# Patient Record
Sex: Male | Born: 2000 | Race: White | Hispanic: No | Marital: Single | Smoking: Never smoker
Health system: Southern US, Community
[De-identification: ages and names within clinical notes are randomized; demographics above are authoritative.]

## PROBLEM LIST (undated history)

## (undated) DIAGNOSIS — J45909 Unspecified asthma, uncomplicated: Secondary | ICD-10-CM

## (undated) HISTORY — PX: MOLE REMOVAL: SHX2046

---

## 2019-12-11 ENCOUNTER — Emergency Department: Payer: 59 | Admitting: Certified Registered"

## 2019-12-11 ENCOUNTER — Emergency Department: Payer: 59

## 2019-12-11 ENCOUNTER — Ambulatory Visit
Admission: EM | Admit: 2019-12-11 | Discharge: 2019-12-11 | Disposition: A | Discharge status: Home / Self Care | Payer: 59 | Attending: Emergency Medicine | Admitting: Emergency Medicine

## 2019-12-11 ENCOUNTER — Encounter
Admission: EM | Disposition: A | Discharge status: Home / Self Care | Payer: Self-pay | Source: Home / Self Care | Attending: Emergency Medicine

## 2019-12-11 ENCOUNTER — Other Ambulatory Visit: Payer: Self-pay

## 2019-12-11 DIAGNOSIS — K200 Eosinophilic esophagitis: Secondary | ICD-10-CM | POA: Diagnosis not present

## 2019-12-11 DIAGNOSIS — T18128A Food in esophagus causing other injury, initial encounter: Secondary | ICD-10-CM | POA: Diagnosis not present

## 2019-12-11 DIAGNOSIS — X58XXXA Exposure to other specified factors, initial encounter: Secondary | ICD-10-CM | POA: Diagnosis not present

## 2019-12-11 DIAGNOSIS — J45909 Unspecified asthma, uncomplicated: Secondary | ICD-10-CM | POA: Diagnosis not present

## 2019-12-11 DIAGNOSIS — Z79899 Other long term (current) drug therapy: Secondary | ICD-10-CM | POA: Diagnosis not present

## 2019-12-11 DIAGNOSIS — Z20822 Contact with and (suspected) exposure to covid-19: Secondary | ICD-10-CM | POA: Insufficient documentation

## 2019-12-11 HISTORY — PX: ESOPHAGOGASTRODUODENOSCOPY (EGD) WITH PROPOFOL: SHX5813

## 2019-12-11 HISTORY — DX: Unspecified asthma, uncomplicated: J45.909

## 2019-12-11 LAB — CBC WITH DIFFERENTIAL/PLATELET
Abs Immature Granulocytes: 0.03 10*3/uL (ref 0.00–0.07)
Basophils Absolute: 0 10*3/uL (ref 0.0–0.1)
Basophils Relative: 0 %
Eosinophils Absolute: 1.1 10*3/uL — ABNORMAL HIGH (ref 0.0–0.5)
Eosinophils Relative: 12 %
HCT: 40.8 % (ref 39.0–52.0)
Hemoglobin: 13.8 g/dL (ref 13.0–17.0)
Immature Granulocytes: 0 %
Lymphocytes Relative: 18 %
Lymphs Abs: 1.6 10*3/uL (ref 0.7–4.0)
MCH: 28 pg (ref 26.0–34.0)
MCHC: 33.8 g/dL (ref 30.0–36.0)
MCV: 82.9 fL (ref 80.0–100.0)
Monocytes Absolute: 0.6 10*3/uL (ref 0.1–1.0)
Monocytes Relative: 7 %
Neutro Abs: 5.6 10*3/uL (ref 1.7–7.7)
Neutrophils Relative %: 63 %
Platelets: 276 10*3/uL (ref 150–400)
RBC: 4.92 MIL/uL (ref 4.22–5.81)
RDW: 14.3 % (ref 11.5–15.5)
WBC: 9 10*3/uL (ref 4.0–10.5)
nRBC: 0 % (ref 0.0–0.2)

## 2019-12-11 LAB — BASIC METABOLIC PANEL
Anion gap: 10 (ref 5–15)
BUN: 19 mg/dL (ref 6–20)
CO2: 23 mmol/L (ref 22–32)
Calcium: 9.3 mg/dL (ref 8.9–10.3)
Chloride: 105 mmol/L (ref 98–111)
Creatinine, Ser: 0.87 mg/dL (ref 0.61–1.24)
GFR, Estimated: >60 mL/min (ref >60)
Glucose, Bld: 98 mg/dL (ref 70–99)
Potassium: 4 mmol/L (ref 3.5–5.1)
Sodium: 138 mmol/L (ref 135–145)

## 2019-12-11 LAB — RESPIRATORY PANEL BY RT PCR (FLU A&B, COVID)
Influenza A by PCR: NEGATIVE
Influenza B by PCR: NEGATIVE
SARS Coronavirus 2 by RT PCR: NEGATIVE

## 2019-12-11 SURGERY — ESOPHAGOGASTRODUODENOSCOPY (EGD) WITH PROPOFOL
Anesthesia: General

## 2019-12-11 MED ORDER — SODIUM CHLORIDE 0.9 % IV SOLN: INTRAVENOUS | Status: DC

## 2019-12-11 MED ORDER — PROPOFOL 10 MG/ML IV BOLUS
INTRAVENOUS | Status: DC | PRN
  Administered 2019-12-11: 20 mg via INTRAVENOUS
  Administered 2019-12-11: 180 mg via INTRAVENOUS

## 2019-12-11 MED ORDER — MIDAZOLAM HCL 2 MG/2ML IJ SOLN
INTRAMUSCULAR | Status: DC | PRN
  Administered 2019-12-11: 2 mg via INTRAVENOUS

## 2019-12-11 MED ORDER — LIDOCAINE HCL (PF) 2 % IJ SOLN
INTRAMUSCULAR | Status: AC
  Filled 2019-12-11: qty 5

## 2019-12-11 MED ORDER — SUCCINYLCHOLINE CHLORIDE 20 MG/ML IJ SOLN
INTRAMUSCULAR | Status: DC | PRN
  Administered 2019-12-11: 100 mg via INTRAVENOUS

## 2019-12-11 MED ORDER — OMEPRAZOLE 20 MG PO CPDR: 40.0000 mg | DELAYED_RELEASE_CAPSULE | Freq: Every day | ORAL | 2 refills | Status: AC

## 2019-12-11 MED ORDER — SODIUM CHLORIDE 0.9 % IV SOLN
INTRAVENOUS | Status: DC
  Administered 2019-12-11: 1000 mL via INTRAVENOUS

## 2019-12-11 MED ORDER — GLUCAGON HCL (RDNA) 1 MG IJ SOLR
1.0000 mg | Freq: Once | INTRAMUSCULAR | Status: AC
  Administered 2019-12-11: 1 mg via INTRAVENOUS
  Filled 2019-12-11: qty 1

## 2019-12-11 MED ORDER — MIDAZOLAM HCL 2 MG/2ML IJ SOLN
INTRAMUSCULAR | Status: AC
  Filled 2019-12-11: qty 2

## 2019-12-11 MED ORDER — PROPOFOL 10 MG/ML IV BOLUS
INTRAVENOUS | Status: AC
  Filled 2019-12-11: qty 20

## 2019-12-11 MED ORDER — ONDANSETRON HCL 4 MG/2ML IJ SOLN
INTRAMUSCULAR | Status: AC
  Filled 2019-12-11: qty 2

## 2019-12-11 MED ORDER — DEXAMETHASONE SODIUM PHOSPHATE 10 MG/ML IJ SOLN
INTRAMUSCULAR | Status: DC | PRN
  Administered 2019-12-11: 10 mg via INTRAVENOUS

## 2019-12-11 MED ORDER — ONDANSETRON HCL 4 MG/2ML IJ SOLN
INTRAMUSCULAR | Status: DC | PRN
  Administered 2019-12-11: 4 mg via INTRAVENOUS

## 2019-12-11 MED ORDER — SUCCINYLCHOLINE CHLORIDE 200 MG/10ML IV SOSY
PREFILLED_SYRINGE | INTRAVENOUS | Status: AC
  Filled 2019-12-11: qty 10

## 2019-12-11 MED ORDER — LIDOCAINE HCL (CARDIAC) PF 100 MG/5ML IV SOSY
PREFILLED_SYRINGE | INTRAVENOUS | Status: DC | PRN
  Administered 2019-12-11: 60 mg via INTRAVENOUS

## 2019-12-11 MED ORDER — DEXAMETHASONE SODIUM PHOSPHATE 10 MG/ML IJ SOLN
INTRAMUSCULAR | Status: AC
  Filled 2019-12-11: qty 1

## 2019-12-11 NOTE — ED Notes (Signed)
No relief from glucagon

## 2019-12-11 NOTE — Op Note (Signed)
[facility]enterology Patient Name: Willie Woods Procedure Date: 12/11/2019 1:15 PM MRN: [id number] Account #: [id number] Date of Birth: [birth year] Admit Type: Outpatient Age: 19 Room: [facility] ENDO ROOM 4 Gender: Male Note Status: Finalized Procedure:             Upper GI endoscopy Indications:           Foreign body in the esophagus Providers:             Wyline Mood MD, MD Medicines:             Monitored Anesthesia Care, General Anesthesia Complications:         No immediate complications. Procedure:             Pre-Anesthesia Assessment:                        - Prior to the procedure, a History and Physical was                         performed, and patient medications, allergies and                         sensitivities were reviewed. The patient's tolerance                         of previous anesthesia was reviewed.                        - The risks and benefits of the procedure and the                         sedation options and risks were discussed with the                         patient. All questions were answered and informed                         consent was obtained.                        - ASA Grade Assessment: I - A normal, healthy patient.                        After obtaining informed consent, the endoscope was                         passed under direct vision. Throughout the procedure,                         the patient's blood pressure, pulse, and oxygen                         saturations were monitored continuously. The Endoscope                         was introduced through the mouth, and advanced to the                         third part of duodenum. The upper GI endoscopy was  accomplished with ease. The patient tolerated the                         procedure well. Findings:      Food was found in the lower third of the esophagus. Removal of food was       accomplished.      The stomach was  normal.      The examined duodenum was normal.      The cardia and gastric fundus were normal on retroflexion.      Mucosal changes including longitudinal furrows, white plaques,       congestion (edema), crepe paper esophagus and white specks were found in       the entire esophagus. Esophageal findings were graded using the       Eosinophilic Esophagitis Endoscopic Reference Score (EoE-EREFS) as:       Edema Grade 1 Present (decreased clarity or absence of vascular       markings), Rings Grade 1 Mild (subtle circumferential ridges seen on       esophageal distension), Exudates Grade 1 Mild (scattered white lesions       involving less than 10 percent of the esophageal surface area), Furrows       Grade 1 Present (vertical lines with or without visible depth) and       Stricture none (no stricture found). Impression:            - Food in the lower third of the esophagus. Removal                         was successful.                        - Normal stomach.                        - Normal examined duodenum.                        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 Recommendation:        - Discharge patient to home (with escort).                        - Mechanical soft diet avoid meat particularly beef,                         chicken ,pork.                        - Continue present medications.                        - Use Prilosec (omeprazole) 40 mg PO daily for 2                         months.                        - Return to GI office who is established with  previously for follow up. Procedure Code(s):     --- Professional ---                        [phone], Esophagogastroduodenoscopy, flexible,                         transoral; with removal of foreign body(s) Diagnosis Code(s):     --- Professional ---                        V78.469G, Food in esophagus causing other injury,                          initial encounter                        K22.8, Other specified diseases of esophagus                        T18.108A, Unspecified foreign body in esophagus                         causing other injury, initial encounter CPT copyright 2019 American Medical Association. All rights reserved. The codes documented in this report are preliminary and upon coder review may  be revised to meet current compliance requirements. Wyline Mood, MD Wyline Mood MD, MD 12/11/2019 2:40:03 PM This report has been signed electronically. Number of Addenda: 0 Note Initiated On: 12/11/2019 1:15 PM Estimated Blood Loss:  Estimated blood loss: none.      [facility]

## 2019-12-11 NOTE — H&P (Signed)
Wyline Mood, MD [address], [place], [place], [address] [address], [place], [place], [address] Phone: [phone]  Fax: [phone]  Primary Care Physician:  Patient, No Pcp Per   Pre-Procedure History & Physical: HPI:  Willie Woods is a 19 y.o. male is here for an endoscopy    Past Medical History:  Diagnosis Date  . Asthma     Past Surgical History:  Procedure Laterality Date  . MOLE REMOVAL      Prior to Admission medications   Not on File    Allergies as of 12/11/2019  . (No Known Allergies)    No family history on file.  Social History   Socioeconomic History  . Marital status: Single    Spouse name: Not on file  . Number of children: Not on file  . Years of education: Not on file  . Highest education level: Not on file  Occupational History  . Not on file  Tobacco Use  . Smoking status: Never Smoker  . Smokeless tobacco: Never Used  Substance and Sexual Activity  . Alcohol use: Yes    Comment: Occassional  . Drug use: Not Currently  . Sexual activity: Not on file  Other Topics Concern  . Not on file  Social History Narrative  . Not on file   Social Determinants of Health   Financial Resource Strain:   . Difficulty of Paying Living Expenses: Not on file  Food Insecurity:   . Worried About Programme researcher, broadcasting/film/video in the Last Year: Not on file  . Ran Out of Food in the Last Year: Not on file  Transportation Needs:   . Lack of Transportation (Medical): Not on file  . Lack of Transportation (Non-Medical): Not on file  Physical Activity:   . Days of Exercise per Week: Not on file  . Minutes of Exercise per Session: Not on file  Stress:   . Feeling of Stress : Not on file  Social Connections:   . Frequency of Communication with Friends and Family: Not on file  . Frequency of Social Gatherings with Friends and Family: Not on file  . Attends Religious Services: Not on file  . Active Member of Clubs or  Organizations: Not on file  . Attends Banker Meetings: Not on file  . Marital Status: Not on file  Intimate Partner Violence:   . Fear of Current or Ex-Partner: Not on file  . Emotionally Abused: Not on file  . Physically Abused: Not on file  . Sexually Abused: Not on file    Review of Systems: See HPI, otherwise negative ROS  Physical Exam: BP (!) 147/83 (BP Location: Left Arm)   Pulse 96   Temp 98.6 F (37 C) (Oral)   Resp 20   Ht 5\' 7"  (1.702 m)   Wt 68 kg   SpO2 95%   BMI 23.49 kg/m  General:   Alert,  pleasant and cooperative in NAD Head:  Normocephalic and atraumatic. Neck:  Supple; no masses or thyromegaly. Lungs:  Clear throughout to auscultation, normal respiratory effort.    Heart:  +S1, +S2, Regular rate and rhythm, No edema. Abdomen:  Soft, nontender and nondistended. Normal bowel sounds, without guarding, and without rebound.   Neurologic:  Alert and  oriented x4;  grossly normal neurologically.  Impression/Plan: Willie Woods is here for an endoscopy  to be performed for  evaluation of food impaction    Risks, benefits, limitations,  and alternatives regarding endoscopy have been reviewed with the patient.  Questions have been answered.  All parties agreeable.   Wyline Mood, MD  12/11/2019, 1:25 PM

## 2019-12-11 NOTE — ED Provider Notes (Signed)
[facility] Emergency Department Provider Note ____________________________________________   First MD Initiated Contact with Patient 12/11/19 1130     (approximate)  I have reviewed the triage vital signs and the nursing notes.  HISTORY  Chief Complaint Foreign Body   HPI Willie Woods is a 19 y.o. malewho presents to the ED for evaluation of possible esophageal foreign body.   Chart review indicates patient follows with pediatric GI.  History of eosinophilic esophagitis without complete adherence to prescribed swallowed steroid regimen.  Switch from Pulmicort to Flovent.  Also prescribed PPI. Telemedicine visit with [facility] for children on 07/2019 noted.   Last documented endoscopy was November 2019 with mucosal healing, though subepithelial fibrosis was present.  Patient reports compliance with his medications, but despite this has felt a persistent food bolus sensation since last night while eating steak.  Reports inability to pass liquids or solids due to the painful sensation in his mid chest.  Reports this has never happened before because they often passed on their own, and he has never required an EGD for food impaction.  Denies preceding illnesses, fevers, syncope, cough.  He is conversational full sentences, but sitting up in bed spitting into a bag.    Past Medical History:  Diagnosis Date  . Asthma     There are no problems to display for this patient.   Past Surgical History:  Procedure Laterality Date  . MOLE REMOVAL      Prior to Admission medications   Medication Sig Start Date End Date Taking? Authorizing Provider  atomoxetine (STRATTERA) 10 MG capsule Take 10 mg by mouth daily.   Yes [provider]  atomoxetine (STRATTERA) 25 MG capsule Take 25 mg by mouth daily.   Yes [provider]  busPIRone (BUSPAR) 10 MG tablet Take 10 mg by mouth 2 (two) times daily.   Yes [provider]    clonazePAM (KLONOPIN) 0.1 mg/mL SUSP Take 1 mg by mouth.   Yes [provider]  Dexmethylphenidate HCl (FOCALIN XR) 30 MG CP24 Take 30 mg by mouth.   Yes [provider]  fexofenadine (ALLEGRA) 180 MG tablet Take 180 mg by mouth daily.   Yes [provider]  Melatonin 5 MG/ML LIQD Take by mouth.   Yes [provider]  QUEtiapine (SEROQUEL XR) 300 MG 24 hr tablet Take 300 mg by mouth at bedtime.   Yes [provider]    Allergies Patient has no known allergies.  History reviewed. No pertinent family history.  Social History Social History   Tobacco Use  . Smoking status: Never Smoker  . Smokeless tobacco: Never Used  Substance Use Topics  . Alcohol use: Yes    Comment: Occassional  . Drug use: Not Currently    Review of Systems  Constitutional: No fever/chills Eyes: No visual changes. ENT: No sore throat. Cardiovascular: Positive for chest pain. Respiratory: Denies shortness of breath. Gastrointestinal: No abdominal pain.  No nausea, no vomiting.  No diarrhea.  No constipation. Genitourinary: Negative for dysuria. Musculoskeletal: Negative for back pain. Skin: Negative for rash. Neurological: Negative for headaches, focal weakness or numbness.   ____________________________________________   PHYSICAL EXAM:  VITAL SIGNS: Vitals:   12/11/19 0813 12/11/19 1343  BP: (!) 147/83 (!) 146/96  Pulse: 96 82  Resp: 20 20  Temp: 98.6 F (37 C) 99 F (37.2 C)  SpO2: 95% 100%     Constitutional: Alert and oriented.  Uncomfortable-appearing, sitting up in bed with emesis  bag in front of him frequently spitting into this.  No evidence of respiratory distress, he is conversational full sentences. Eyes: Conjunctivae are normal. PERRL. EOMI. Head: Atraumatic. Nose: No congestion/rhinnorhea. Mouth/Throat: Mucous membranes are moist.  Oropharynx non-erythematous. Neck: No stridor. No cervical spine tenderness to  palpation. Cardiovascular: Normal rate, regular rhythm. Grossly normal heart sounds.  Good peripheral circulation. Respiratory: Normal respiratory effort.  No retractions. Lungs CTAB. Gastrointestinal: Soft , nondistended, nontender to palpation. No abdominal bruits. No CVA tenderness. Musculoskeletal: No lower extremity tenderness nor edema.  No joint effusions. No signs of acute trauma. Neurologic:  Normal speech and language. No gross focal neurologic deficits are appreciated. No gait instability noted. Skin:  Skin is warm, dry and intact. No rash noted. Psychiatric: Mood and affect are normal. Speech and behavior are normal.  ____________________________________________   LABS (all labs ordered are listed, but only abnormal results are displayed)  Labs Reviewed  CBC WITH DIFFERENTIAL/PLATELET - Abnormal; Notable for the following components:      Result Value   Eosinophils Absolute 1.1 (*)    All other components within normal limits  RESPIRATORY PANEL BY RT PCR (FLU A&B, COVID)  SARS CORONAVIRUS 2 (TAT 6-24 HRS)  BASIC METABOLIC PANEL   ____________________________________________  RADIOLOGY  ED MD interpretation: 2 view CXR without evidence of acute cardiopulmonary pathology or radiopaque foreign bodies in the esophagus  Official radiology report(s): DG Chest 2 View  Result Date: 12/11/2019 CLINICAL DATA:  Possible foreign body. EXAM: CHEST - 2 VIEW COMPARISON:  None. FINDINGS: The heart size and mediastinal contours are within normal limits. Both lungs are clear. The visualized skeletal structures are unremarkable. No radiopaque foreign body is noted. IMPRESSION: Negative. Electronically Signed   By: Lupita Raider M.D.   On: 12/11/2019 08:46   ____________________________________________   PROCEDURES and INTERVENTIONS  Procedure(s) performed (including Critical Care):  Procedures  Medications  0.9 %  sodium chloride infusion (1,000 mLs Intravenous New Bag/Given  12/11/19 1353)  0.9 %  sodium chloride infusion (has no administration in time range)  glucagon (GLUCAGEN) injection 1 mg (1 mg Intravenous Given 12/11/19 1217)    ____________________________________________   MDM / ED COURSE  19 year old patient with history of eosinophilic esophagitis presents with food impaction requiring EGD.  Normal vital signs on room air.  No evidence of respiratory distress, the patient is unable to pass liquids or solids at this time.  Benign abdomen and patient is conversational full sentences.  Unable to pass this clinically with glucagon, so GI was consulted and will admit the patient for EGD.  Clinical Course as of Dec 11 1406  Fri Dec 11, 2019  1244 Reassessed.  Has not passed food bolus with glucagon administration about 30 minutes ago.  GI paged   [DS]  1252 Spoke with GI, Dr. Tobi Bastos, who will immediately come assess the patient for possible endoscopy.   [DS]    Clinical Course User Index [DS] Delton Prairie, MD     ____________________________________________   FINAL CLINICAL IMPRESSION(S) / ED DIAGNOSES  Final diagnoses:  Food impaction of esophagus, initial encounter  Eosinophilic esophagitis     ED Discharge Orders    None       Jassica Zazueta Katrinka Blazing   Note:  This document was prepared using Dragon voice recognition software and may include unintentional dictation errors.   Delton Prairie, MD 12/11/19 1409

## 2019-12-11 NOTE — Anesthesia Procedure Notes (Signed)
Procedure Name: Intubation Date/Time: 12/11/2019 2:20 PM Performed by: Dava Najjar, CRNA Pre-anesthesia Checklist: Patient identified, Emergency Drugs available, Suction available and Patient being monitored Patient Re-evaluated:Patient Re-evaluated prior to induction Oxygen Delivery Method: Circle system utilized Preoxygenation: Pre-oxygenation with 100% oxygen Induction Type: IV induction and Rapid sequence Laryngoscope Size: Miller and 2 Grade View: Grade I Tube type: Oral Tube size: 7.5 mm Number of attempts: 1 Airway Equipment and Method: Stylet Placement Confirmation: ETT inserted through vocal cords under direct vision,  positive ETCO2 and breath sounds checked- equal and bilateral Secured at: 23 cm Tube secured with: Tape Dental Injury: Teeth and Oropharynx as per pre-operative assessment

## 2019-12-11 NOTE — ED Triage Notes (Signed)
Pt states he had a piece of beef get stuff in his esophagus last night and has not been able to get it to pass, not able to maintain own sputum at present. States this has happened before but would also pass without the need for medical help.

## 2019-12-11 NOTE — Transfer of Care (Signed)
Immediate Anesthesia Transfer of Care Note  Patient: Willie Woods  Procedure(s) Performed: ESOPHAGOGASTRODUODENOSCOPY (EGD) WITH PROPOFOL (N/A )  Patient Location: PACU  Anesthesia Type:General  Level of Consciousness: drowsy and patient cooperative  Airway & Oxygen Therapy: Patient Spontanous Breathing and Patient connected to face mask oxygen  Post-op Assessment: Report given to RN and Post -op Vital signs reviewed and stable  Post vital signs: Reviewed and stable  Last Vitals:  Vitals Value Taken Time  BP 125/71 12/11/19 1453  Temp    Pulse 75 12/11/19 1454  Resp 19 12/11/19 1454  SpO2 100 % 12/11/19 1454  Vitals shown include unvalidated device data.  Last Pain:  Vitals:   12/11/19 1343  TempSrc: Oral  PainSc: 0-No pain      Patients Stated Pain Goal: 0 (12/11/19 1343)  Complications: No complications documented.

## 2019-12-11 NOTE — ED Notes (Signed)
Pt placed in subwait at this time, pt given coke to sip on by this RN per EDP Scotty Court. Pt updated on plan of care.

## 2019-12-11 NOTE — Consult Note (Signed)
Wyline Mood , MD [address], [address], [place], [place], [address] [address], [place], [place], [address] Phone: [phone]  Fax: [phone]  Consultation  Referring Provider:   Dr Katrinka Blazing  Primary Care Physician:  Patient, No Pcp Per Primary Gastroenterologist:  At [facility]         Reason for Consultation:     Food impaction  Date of Admission:  12/11/2019 Date of Consultation:  12/11/2019         HPI:   Willie Woods is a 19 y.o. male carries a diagnosis of eosinophilic esophagitis diagnosed and managed by a gastroenterologist at [facility].  He is a Consulting civil engineer at [employer].  He has been compliant with his Flovent inhalers.  Not been on a PPI.  He has never required endoscopy for food impaction which usually passes down on its own.  He states that since last night after he ate a piece of steak unable to swallow anything.  When I was in the room with him he was spitting constantly into a bag.  No other complaints.  Past Medical History:  Diagnosis Date  . Asthma     Past Surgical History:  Procedure Laterality Date  . MOLE REMOVAL      Prior to Admission medications   Not on File    No family history on file.   Social History   Tobacco Use  . Smoking status: Never Smoker  . Smokeless tobacco: Never Used  Substance Use Topics  . Alcohol use: Yes    Comment: Occassional  . Drug use: Not Currently    Allergies as of 12/11/2019  . (No Known Allergies)    Review of Systems:    All systems reviewed and negative except where noted in HPI.   Physical Exam:  Vital signs in last 24 hours: Temp:  [98.6 F (37 C)] 98.6 F (37 C) (10/22 0813) Pulse Rate:  [96] 96 (10/22 0813) Resp:  [20] 20 (10/22 0813) BP: (147)/(83) 147/83 (10/22 0813) SpO2:  [95 %] 95 % (10/22 0813) Weight:  [68 kg] 68 kg (10/22 0810)   General:   Pleasant, cooperative in NAD Head:  Normocephalic and atraumatic. Eyes:   No icterus.   Conjunctiva pink. PERRLA. Ears:   Normal auditory acuity. Neck:  Supple; no masses or thyroidomegaly Lungs: Respirations even and unlabored. Lungs clear to auscultation bilaterally.   No wheezes, crackles, or rhonchi.  Heart:  Regular rate and rhythm;  Without murmur, clicks, rubs or gallops Abdomen:  Soft, nondistended, nontender. Normal bowel sounds. No appreciable masses or hepatomegaly.  No rebound or guarding.  Neurologic:  Alert and oriented x3;  grossly normal neurologically. Skin:  Intact without significant lesions or rashes. Cervical Nodes:  No significant cervical adenopathy. Psych:  Alert and cooperative. Normal affect.  LAB RESULTS: Recent Labs    12/11/19 0823  WBC 9.0  HGB 13.8  HCT 40.8  PLT 276   BMET Recent Labs    12/11/19 0823  NA 138  K 4.0  CL 105  CO2 23  GLUCOSE 98  BUN 19  CREATININE 0.87  CALCIUM 9.3   LFT No results for input(s): PROT, ALBUMIN, AST, ALT, ALKPHOS, BILITOT, BILIDIR, IBILI in the last 72 hours. PT/INR No results for input(s): LABPROT, INR in the last 72 hours.  STUDIES: DG Chest 2 View  Result Date: 12/11/2019 CLINICAL DATA:  Possible foreign body. EXAM: CHEST - 2 VIEW COMPARISON:  None. FINDINGS: The heart size and  mediastinal contours are within normal limits. Both lungs are clear. The visualized skeletal structures are unremarkable. No radiopaque foreign body is noted. IMPRESSION: Negative. Electronically Signed   By: Lupita Raider M.D.   On: 12/11/2019 08:46      Impression / Plan:   Willie Woods is a 19 y.o. y/o male with a past medical history of eosinophilic esophagitis presents to the emergency room with a food impaction since last night.  Plan 1.  EGD  I have discussed alternative options, risks & benefits,  which include, but are not limited to, bleeding, infection, perforation,respiratory complication & drug reaction.  The patient agrees with this plan & written consent will be obtained.     Thank you for involving me in the care of this  patient.      LOS: 0 days   Wyline Mood, MD  12/11/2019, 1:06 PM

## 2019-12-14 NOTE — Anesthesia Preprocedure Evaluation (Signed)
Anesthesia Evaluation  Patient identified by MRN, date of birth, ID band Patient awake    Reviewed: Allergy & Precautions, H&P , NPO status , Patient's Chart, lab work & pertinent test results  History of Anesthesia Complications Negative for: history of anesthetic complications  Airway Mallampati: II  TM Distance: >3 FB     Dental  (+) Teeth Intact   Pulmonary asthma , neg sleep apnea, neg COPD,    breath sounds clear to auscultation       Cardiovascular (-) angina(-) Past MI and (-) Cardiac Stents negative cardio ROS  (-) dysrhythmias  Rhythm:regular Rate:Normal     Neuro/Psych negative neurological ROS  negative psych ROS   GI/Hepatic negative GI ROS, Neg liver ROS,   Endo/Other  negative endocrine ROS  Renal/GU      Musculoskeletal   Abdominal   Peds  Hematology negative hematology ROS (+)   Anesthesia Other Findings Past Medical History: No date: Asthma  Past Surgical History: 12/11/2019: ESOPHAGOGASTRODUODENOSCOPY (EGD) WITH PROPOFOL; N/A     Comment:  Procedure: ESOPHAGOGASTRODUODENOSCOPY (EGD) WITH               PROPOFOL;  Surgeon: Wyline Mood, MD;  [facility]               ENDOSCOPY;  Service: Gastroenterology;  Laterality: N/A; No date: MOLE REMOVAL  BMI    Body Mass Index: 24.75 kg/m      Reproductive/Obstetrics negative OB ROS                             Anesthesia Physical Anesthesia Plan  ASA: II  Anesthesia Plan: General ETT and Rapid Sequence   Post-op Pain Management:    Induction:   PONV Risk Score and Plan: Ondansetron, Dexamethasone, Midazolam and Treatment may vary due to age or medical condition  Airway Management Planned:   Additional Equipment:   Intra-op Plan:   Post-operative Plan:   Informed Consent: I have reviewed the patients History and Physical, chart, labs and discussed the procedure including the risks, benefits and  alternatives for the proposed anesthesia with the patient or authorized representative who has indicated his/her understanding and acceptance.     Dental Advisory Given  Plan Discussed with: Anesthesiologist, CRNA and Surgeon  Anesthesia Plan Comments:         Anesthesia Quick Evaluation

## 2019-12-14 NOTE — Anesthesia Postprocedure Evaluation (Signed)
Anesthesia Post Note  Patient: Jhair Witherington [facility]  Procedure(s) Performed: ESOPHAGOGASTRODUODENOSCOPY (EGD) WITH PROPOFOL (N/A )  Patient location during evaluation: PACU Anesthesia Type: General Level of consciousness: awake and alert Pain management: pain level controlled Vital Signs Assessment: post-procedure vital signs reviewed and stable Respiratory status: spontaneous breathing, nonlabored ventilation and respiratory function stable Cardiovascular status: blood pressure returned to baseline and stable Postop Assessment: no apparent nausea or vomiting Anesthetic complications: no   No complications documented.   Last Vitals:  Vitals:   12/11/19 1508 12/11/19 1523  BP: 140/90 (!) 141/83  Pulse: 91 86  Resp: 15 20  Temp:  (!) 36.1 C  SpO2: 100% 100%    Last Pain:  Vitals:   12/11/19 1523  TempSrc:   PainSc: 2                  Karleen Hampshire

## 2021-11-08 IMAGING — CR DG CHEST 2V
2 series · 2 of 2 positions shown · non-contrast
Comparison: None.

CLINICAL DATA: Possible foreign body.

EXAM:
CHEST - 2 VIEW

[chest pa]
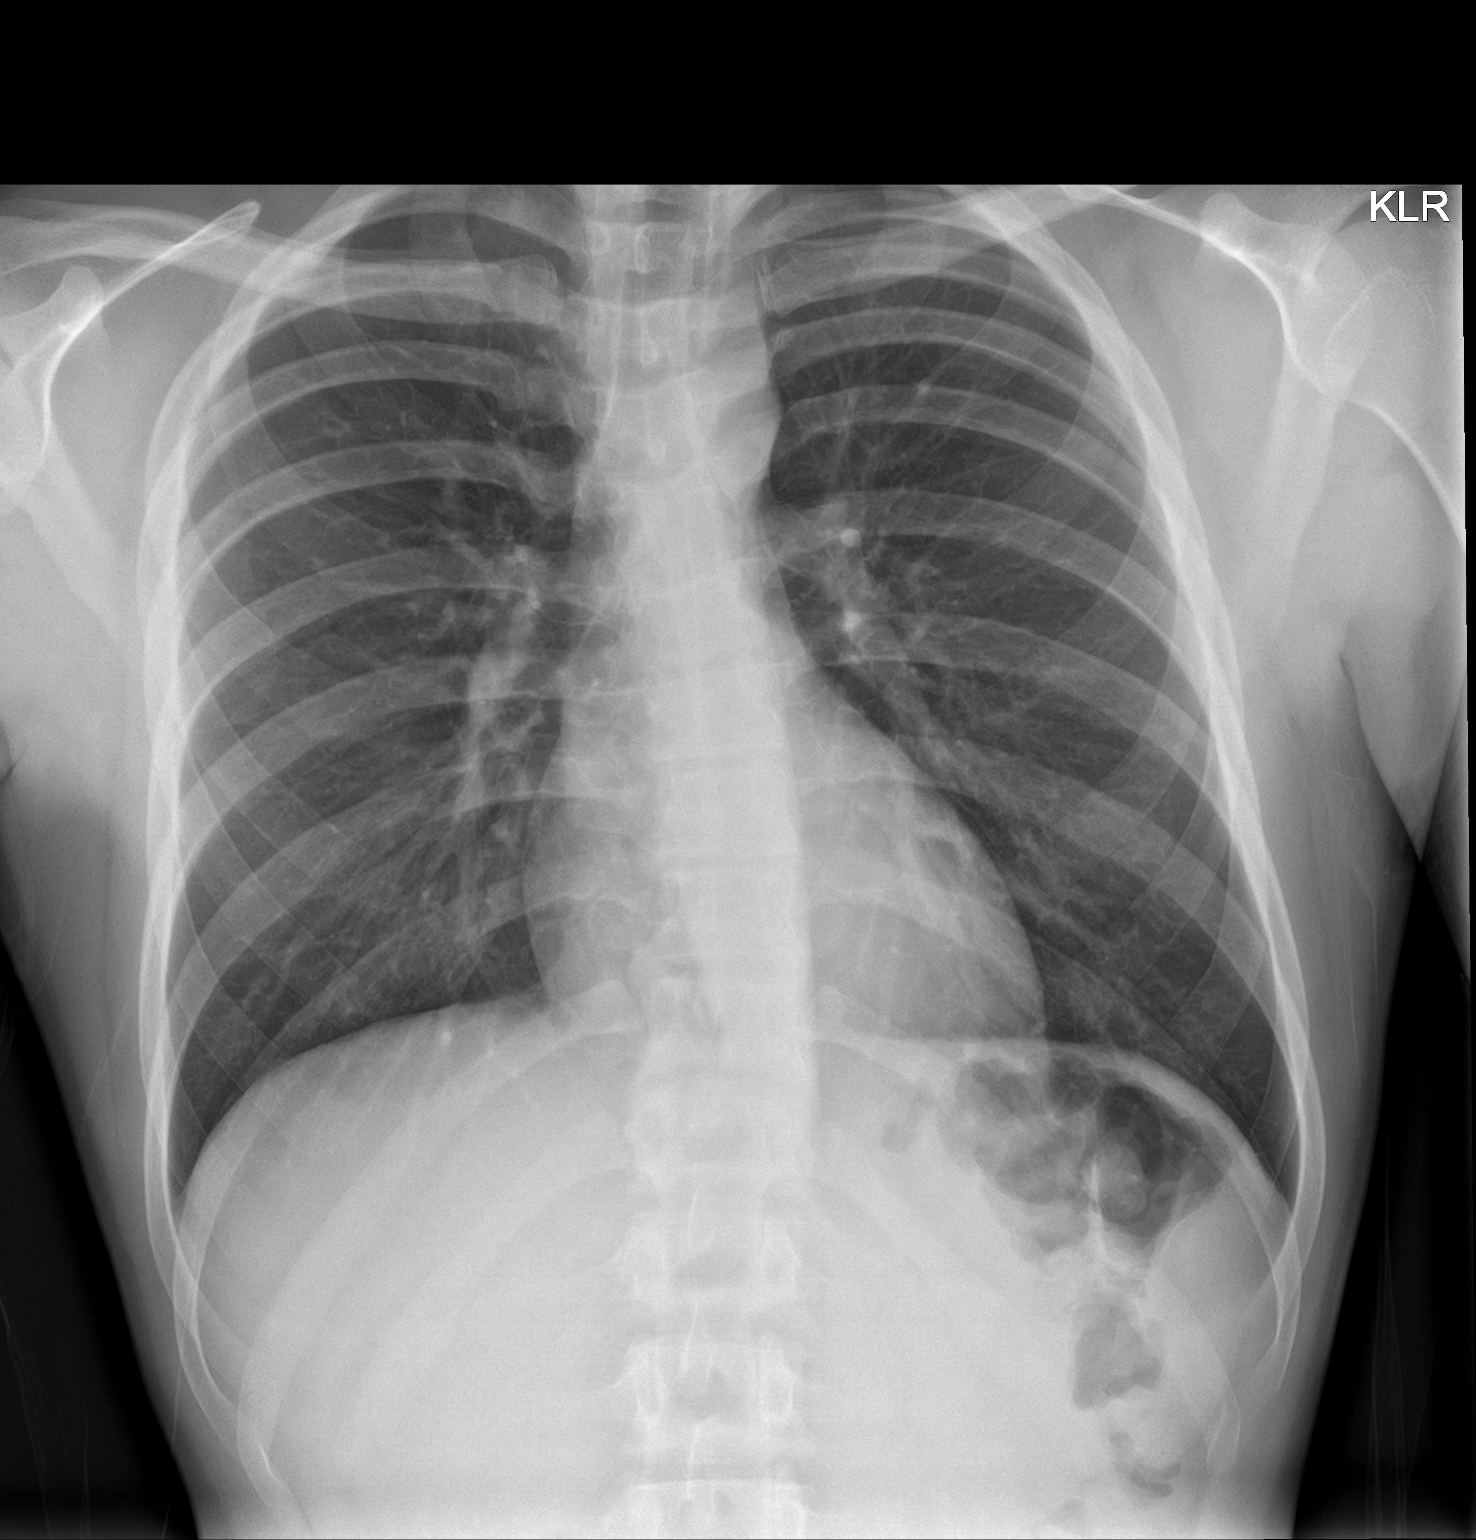

[chest lat]
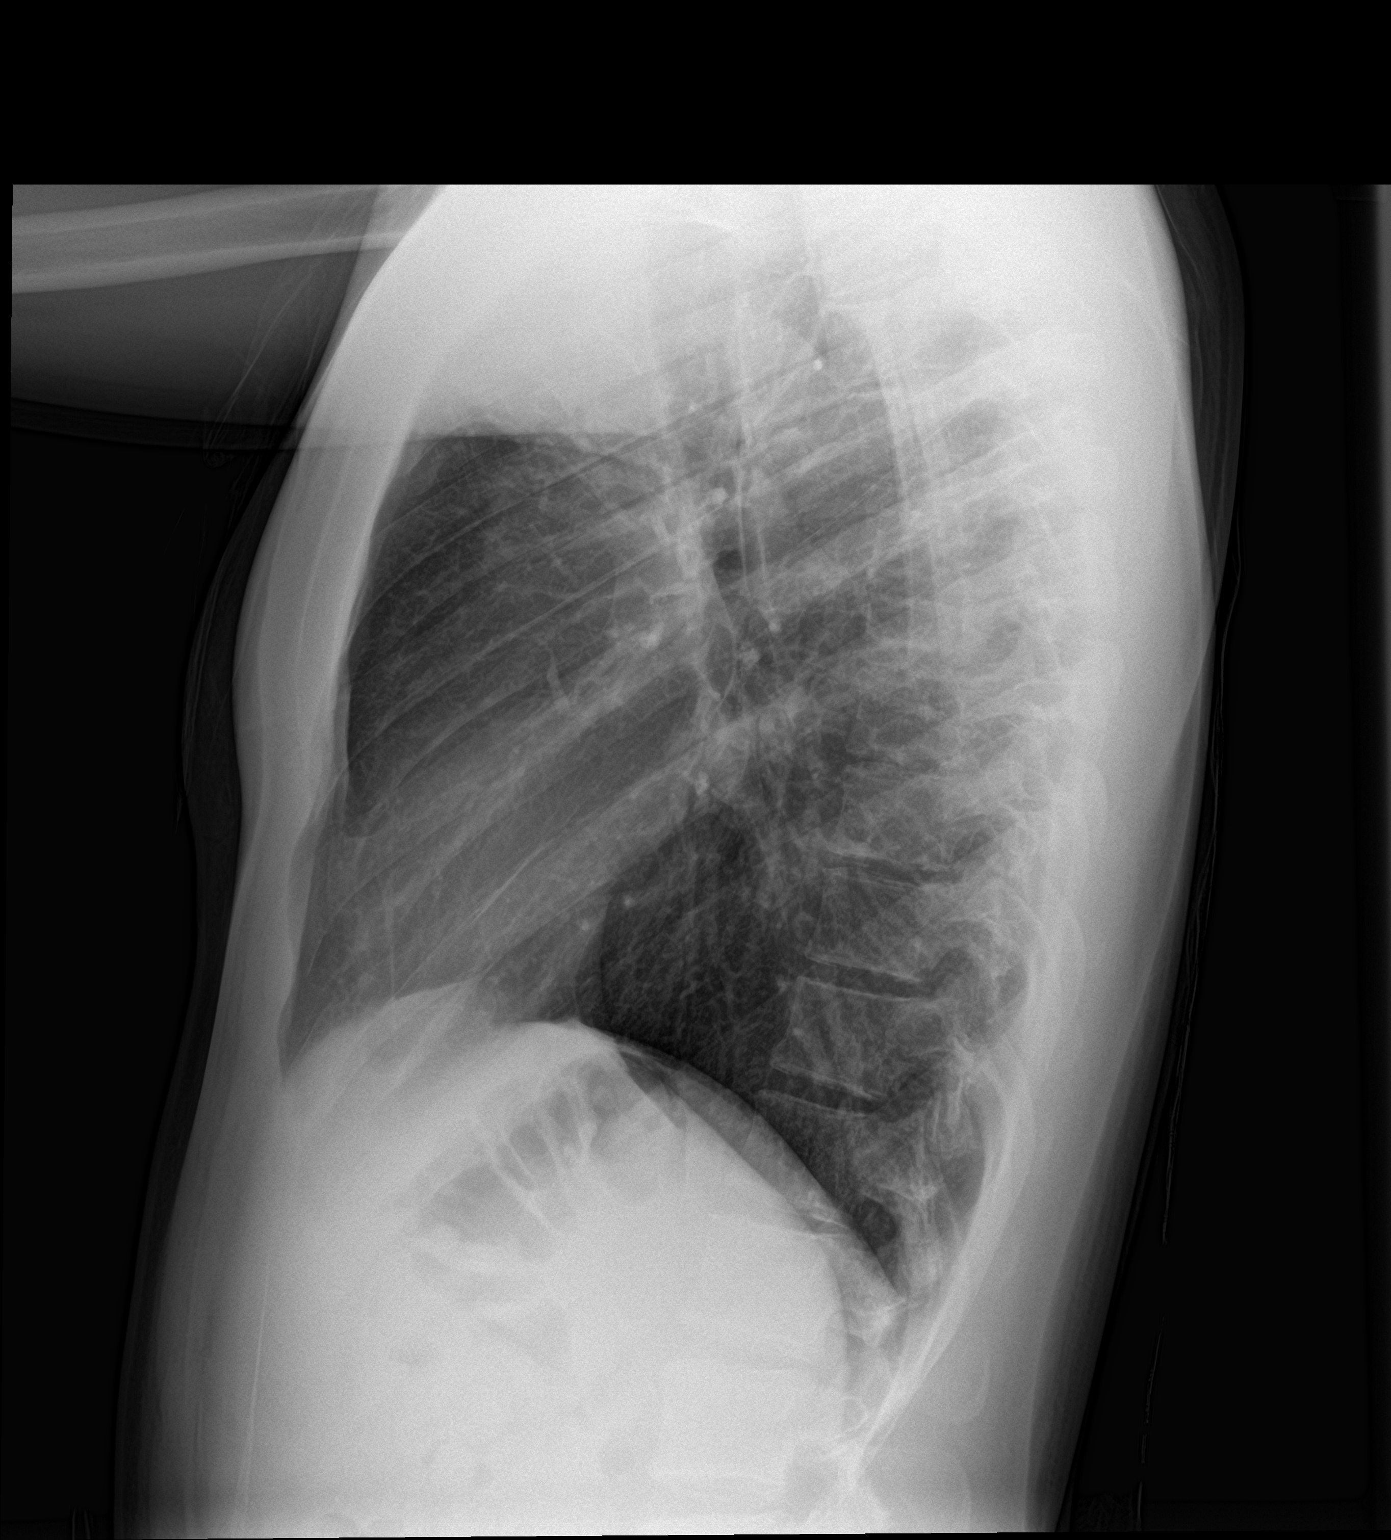

[2 of 2 positions shown; findings below may reference images not displayed]

FINDINGS: The heart size and mediastinal contours are within normal limits.
Both lungs are clear. The visualized skeletal structures are
unremarkable. No radiopaque foreign body is noted.
IMPRESSION: Negative.
